# Patient Record
Sex: Female | Born: 1995
Health system: Southern US, Community
[De-identification: ages and names within clinical notes are randomized; demographics above are authoritative.]

## PROBLEM LIST (undated history)

## (undated) HISTORY — PX: ORBITAL FRACTURE SURGERY: SHX725

---
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
## 2017-01-24 ENCOUNTER — Emergency Department (HOSPITAL_COMMUNITY)
Admission: EM | Admit: 2017-01-24 | Discharge: 2017-01-24 | Disposition: A | Discharge status: Home / Self Care | Payer: BLUE CROSS/BLUE SHIELD | Attending: Emergency Medicine | Admitting: Emergency Medicine

## 2017-01-24 ENCOUNTER — Encounter (HOSPITAL_COMMUNITY): Payer: Self-pay | Admitting: Emergency Medicine

## 2017-01-24 DIAGNOSIS — X58XXXA Exposure to other specified factors, initial encounter: Secondary | ICD-10-CM | POA: Diagnosis not present

## 2017-01-24 DIAGNOSIS — Y9389 Activity, other specified: Secondary | ICD-10-CM | POA: Diagnosis not present

## 2017-01-24 DIAGNOSIS — Z79899 Other long term (current) drug therapy: Secondary | ICD-10-CM | POA: Diagnosis not present

## 2017-01-24 DIAGNOSIS — Y998 Other external cause status: Secondary | ICD-10-CM | POA: Diagnosis not present

## 2017-01-24 DIAGNOSIS — S0502XA Injury of conjunctiva and corneal abrasion without foreign body, left eye, initial encounter: Secondary | ICD-10-CM | POA: Diagnosis not present

## 2017-01-24 DIAGNOSIS — F17210 Nicotine dependence, cigarettes, uncomplicated: Secondary | ICD-10-CM | POA: Insufficient documentation

## 2017-01-24 DIAGNOSIS — Y929 Unspecified place or not applicable: Secondary | ICD-10-CM | POA: Insufficient documentation

## 2017-01-24 DIAGNOSIS — H10022 Other mucopurulent conjunctivitis, left eye: Secondary | ICD-10-CM | POA: Diagnosis present

## 2017-01-24 MED ORDER — ERYTHROMYCIN 5 MG/GM OP OINT: TOPICAL_OINTMENT | OPHTHALMIC | 0 refills | Status: DC

## 2017-01-24 MED ORDER — TETRACAINE HCL 0.5 % OP SOLN
2.0000 [drp] | Freq: Once | OPHTHALMIC | Status: AC
  Administered 2017-01-24: 2 [drp] via OPHTHALMIC
  Filled 2017-01-24: qty 4

## 2017-01-24 MED ORDER — FLUORESCEIN SODIUM 1 MG OP STRP
1.0000 | ORAL_STRIP | Freq: Once | OPHTHALMIC | Status: AC
  Administered 2017-01-24: 1 via OPHTHALMIC
  Filled 2017-01-24: qty 1

## 2017-01-24 MED ORDER — ERYTHROMYCIN 5 MG/GM OP OINT: TOPICAL_OINTMENT | OPHTHALMIC | 0 refills | Status: AC

## 2017-01-24 NOTE — ED Provider Notes (Signed)
[facility]-EMERGENCY DEPT Provider Note   CSN: [id number] Arrival date & time: 01/24/17  1846     History   Chief Complaint Chief Complaint  Patient presents with  . Eye Drainage    HPI Grace Abbott is a 22 y.o. female no significant past medical history, who presents to ED for evaluation of 1 day history of left eye redness, clear drainage and foreign body sensation.  She states that she slept in her corrective contact lenses last night.  She removed the contact lenses this morning but started experiencing redness and drainage in her left eye.  She denies any changes in vision.  Reports history of similar symptoms in the past.  She has used over-the-counter eye irritation drops with significant relief in her symptoms.  She denies any other trauma to the area.  She denies any fever, purulent drainage, sick contacts with similar symptoms.  HPI  History reviewed. No pertinent past medical history.  There are no active problems to display for this patient.   Past Surgical History:  Procedure Laterality Date  . ORBITAL FRACTURE SURGERY     r/side    OB History    No data available       Home Medications    Prior to Admission medications   Medication Sig Start Date End Date Taking? Authorizing Provider  diclofenac (VOLTAREN) 50 MG EC tablet Take 1 tablet (50 mg total) by mouth 2 (two) times daily. 12/10/16   Janne Napoleon, NP  erythromycin ophthalmic ointment Place a 1/2 inch ribbon of ointment into the lower eyelid up to 6 times daily. 01/24/17   Dietrich Pates, PA-C    Family History Family History  Problem Relation Age of Onset  . Hypertension Mother   . Diabetes Father     Social History Social History   Tobacco Use  . Smoking status: Current Some Day Smoker    Types: Cigarettes  . Smokeless tobacco: Current User  Substance Use Topics  . Alcohol use: Yes    Comment: occ  . Drug use: Not on file     Allergies   Patient has no  allergy information on record.   Review of Systems Review of Systems  Constitutional: Negative for chills and fever.  Eyes: Positive for discharge and redness. Negative for photophobia, pain and itching.  Gastrointestinal: Negative for nausea and vomiting.  Skin: Negative for wound.     Physical Exam Updated Vital Signs BP (!) 142/93 (BP Location: Left Arm)   Pulse (!) 101   Temp 99.4 F (37.4 C) (Oral)   Resp 20   SpO2 100%   Physical Exam  Constitutional: She appears well-developed and well-nourished. No distress.  Nontoxic appearing and in no acute distress.  HENT:  Head: Normocephalic and atraumatic.  Eyes: EOM are normal. Pupils are equal, round, and reactive to light. Left eye exhibits discharge. Left conjunctiva is injected. No scleral icterus. Right eye exhibits normal extraocular motion. Left eye exhibits normal extraocular motion.  Left eye with injected conjunctiva, no eyelid swelling or erythema or tenderness to palpation.  Mild clear tearful drainage noted.  No foreign bodies noted.  No pain with EOMs.  No chemosis, proptosis, or consensual photophobia. Fluorescein stain with small corneal abrasion at 3:00 position of L eye; no foreign bodies, dendritic lesions, ulcerations, negative Sidel sign.  Neck: Normal range of motion.  Pulmonary/Chest: Effort normal. No respiratory distress.  Neurological: She is alert.  Skin: No rash noted. She is not diaphoretic.  Psychiatric: She has a normal mood and affect.  Nursing note and vitals reviewed.    ED Treatments / Results  Labs (all labs ordered are listed, but only abnormal results are displayed) Labs Reviewed - No data to display  EKG  EKG Interpretation None       Radiology No results found.  Procedures Procedures (including critical care time)  Medications Ordered in ED Medications  fluorescein ophthalmic strip 1 strip (1 strip Left Eye Given by Other 01/24/17 2017)  tetracaine (PONTOCAINE) 0.5 %  ophthalmic solution 2 drop (2 drops Left Eye Given by Other 01/24/17 2016)     Initial Impression / Assessment and Plan / ED Course  I have reviewed the triage vital signs and the nursing notes.  Pertinent labs & imaging results that were available during my care of the patient were reviewed by me and considered in my medical decision making (see chart for details).     Patient presents to ED for evaluation of left eye redness, clear drainage and for a body sensation upon waking up this morning.  States that she slept her contacts and began having the discomfort after she removed them this morning.  Denies any vision changes, fevers, swelling around eye or other trauma to the area.  On physical exam she is overall well-appearing.  Conjunctival injection and clear drainage noted in left eye.  Fluorescein exam revealed a small corneal abrasion in 3 o'clock position of left eye.  She continues to deny any vision changes.  No abnormalities in visual acuity examination.  There are no other abnormalities unforeseen exam.  I have low suspicion for HSV, orbital cellulitis, or iritis being the cause of her symptoms.  Will give patient antibiotic ointment, advised her to discontinue contact lens use until evaluated by her eye doctor.  Advised Tylenol as needed for pain.  Patient appears stable for discharge at this time.  Strict return precautions given.  Final Clinical Impressions(s) / ED Diagnoses   Final diagnoses:  Abrasion of left cornea, initial encounter    ED Discharge Orders        Ordered    erythromycin ophthalmic ointment  Status:  Discontinued     01/24/17 2044    erythromycin ophthalmic ointment     01/24/17 2105     Portions of this note were generated with Dragon dictation software. Dictation errors may occur despite best attempts at proofreading.    Dietrich PatesKhatri, Chamika Cunanan, PA-C 01/24/17 2111    Pricilla LovelessGoldston, Scott, MD 01/24/17 [phone]

## 2017-01-24 NOTE — ED Provider Notes (Signed)
Patient placed in Quick Look pathway, seen and evaluated   Chief Complaint: left eye redness   HPI:   Left eye redness, tearing, FB sensation and pain to upper eye lid since this morning. Contact corrective lens wearer. Slept in contact lens last night, woke up this morning and noticed symptoms. No changes in vision or headache.   ROS: eye redness   Physical Exam:   Gen: No distress  Neuro: Awake and Alert  Skin: Warm    Focused Exam: Left upper eye lid with mild tenderness but no injury, lag or palpable mass. Clear tearing. Medial scleral injection with prominent vessels, does not cross past iris white. Conjunctiva pink. PERRL and EOMs intact. ?Tiny white spot to 5 o'clock of iris.   Initiation of care has begun. The patient has been counseled on the process, plan, and necessity for staying for the completion/evaluation, and the remainder of the medical screening examination    Jerrell MylarGibbons, Claudia J, PA-C 01/24/17 Fransico [id number]    Goldston, Scott, MD 01/24/17 [phone]

## 2017-01-24 NOTE — Discharge Instructions (Signed)
Please read attached information regarding your condition. Use antibiotic ointment in affected eye as directed. Do not wear contact lenses until evaluated by her eye doctor. Follow-up with your eye doctor for further evaluation. Take Tylenol as needed for pain. Return to ED for worsening symptoms, additional injuries, loss of vision or blurry vision, head injuries or swelling around eye.

## 2017-01-24 NOTE — ED Triage Notes (Signed)
Patient presents with left eye redness and drainage. Light sensitivity and feels like a foreign object in the eye. Slept with contacts in but removed them this am.

## 2018-08-09 IMAGING — CR DG KNEE COMPLETE 4+V*L*
5 series · 5 of 5 positions shown · non-contrast
Comparison: None.

CLINICAL DATA: Left knee pain.  Felt pop

EXAM:
LEFT KNEE - COMPLETE 4+ VIEW

[t knee ap left]
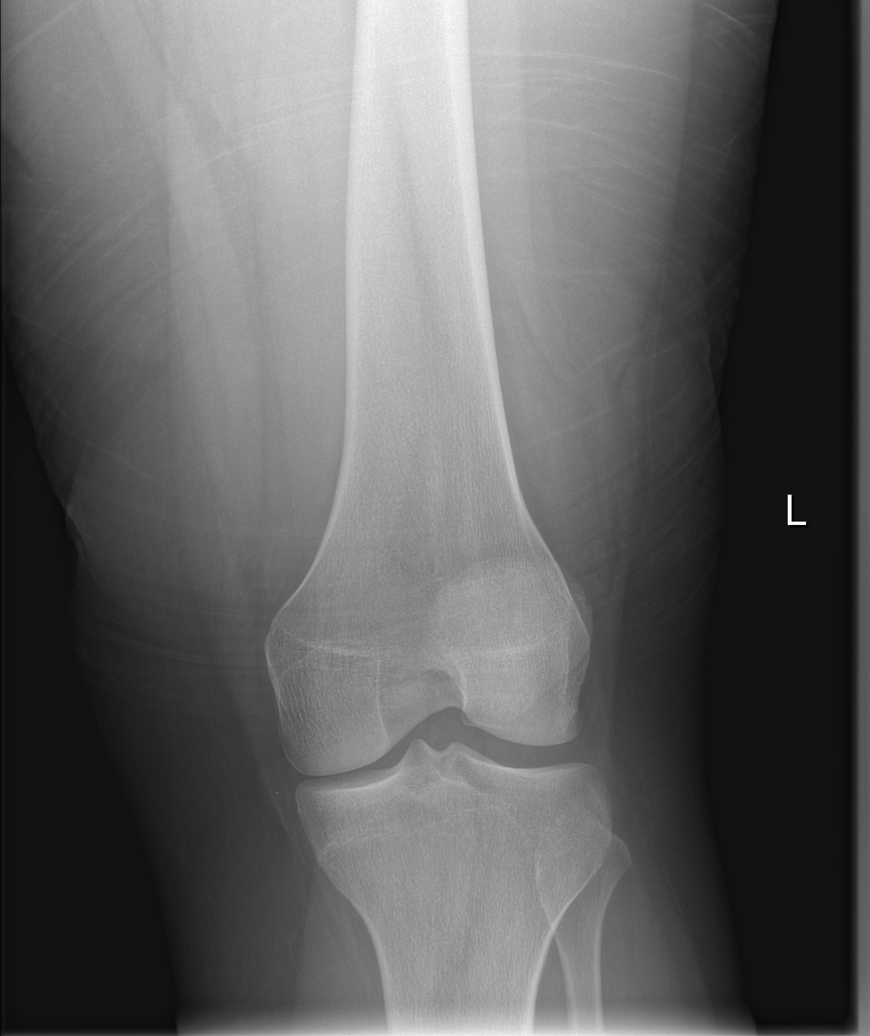

[t knee obl left (1 of 2)]
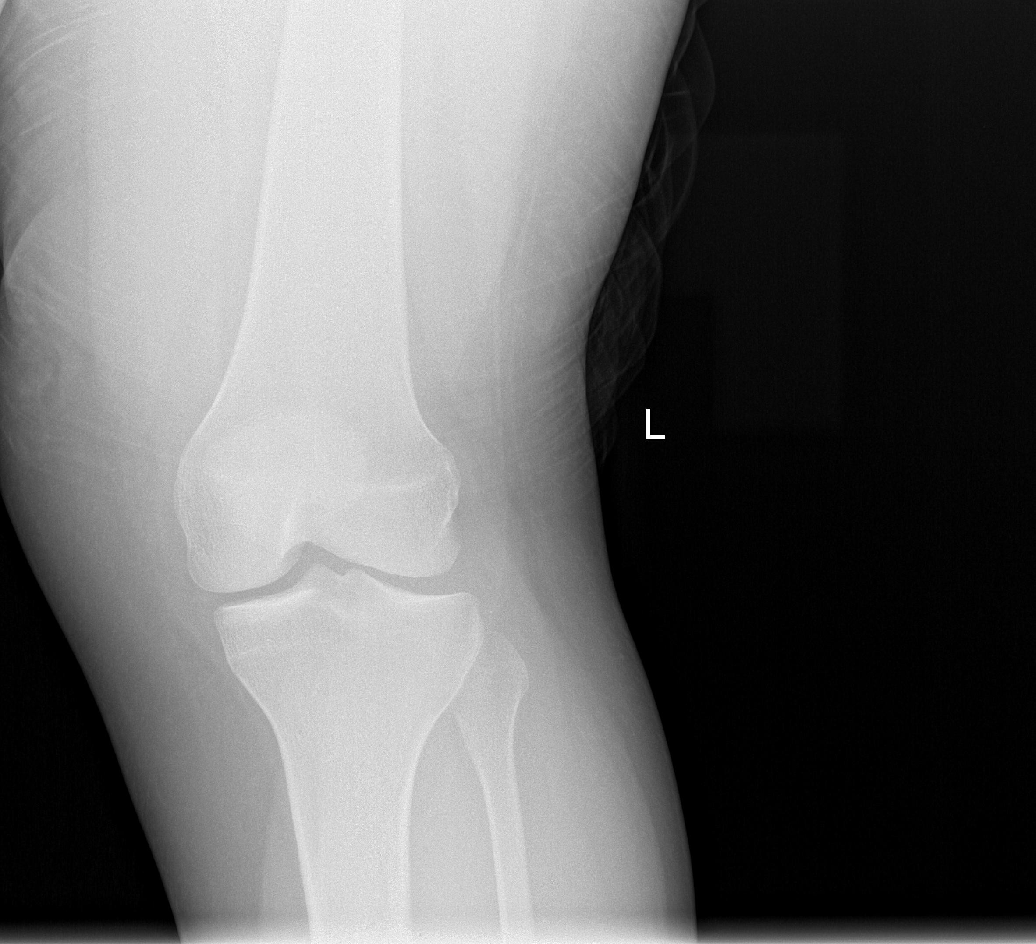

[t knee obl left (2 of 2)]
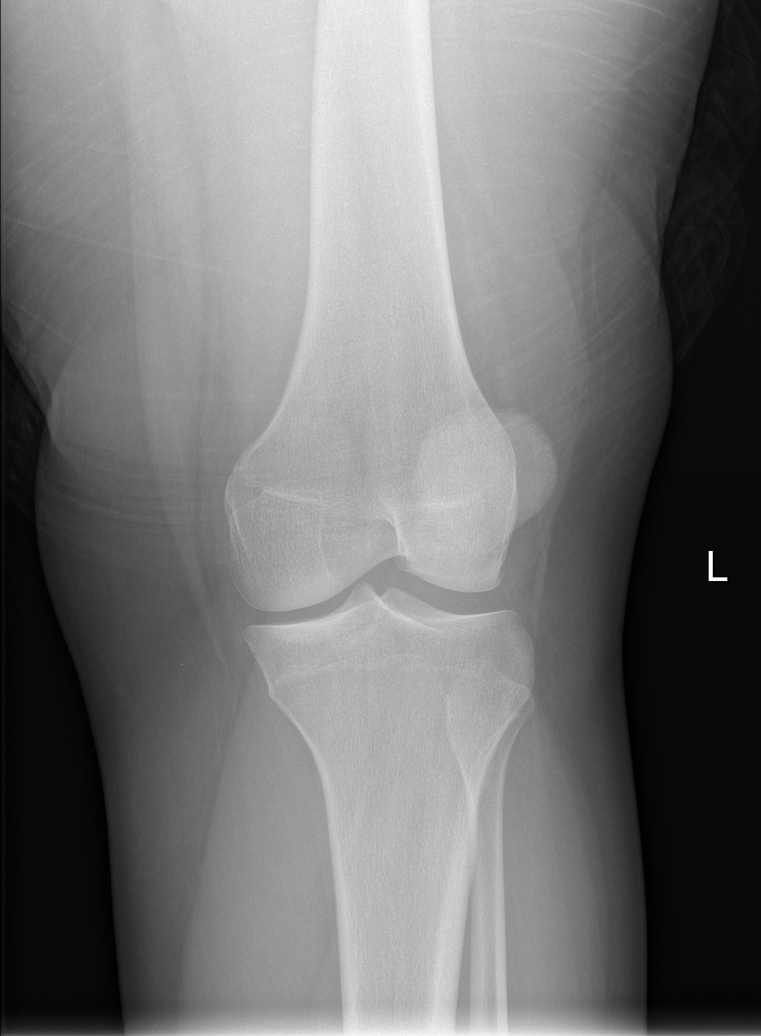

[x knee lat left (1 of 2)]
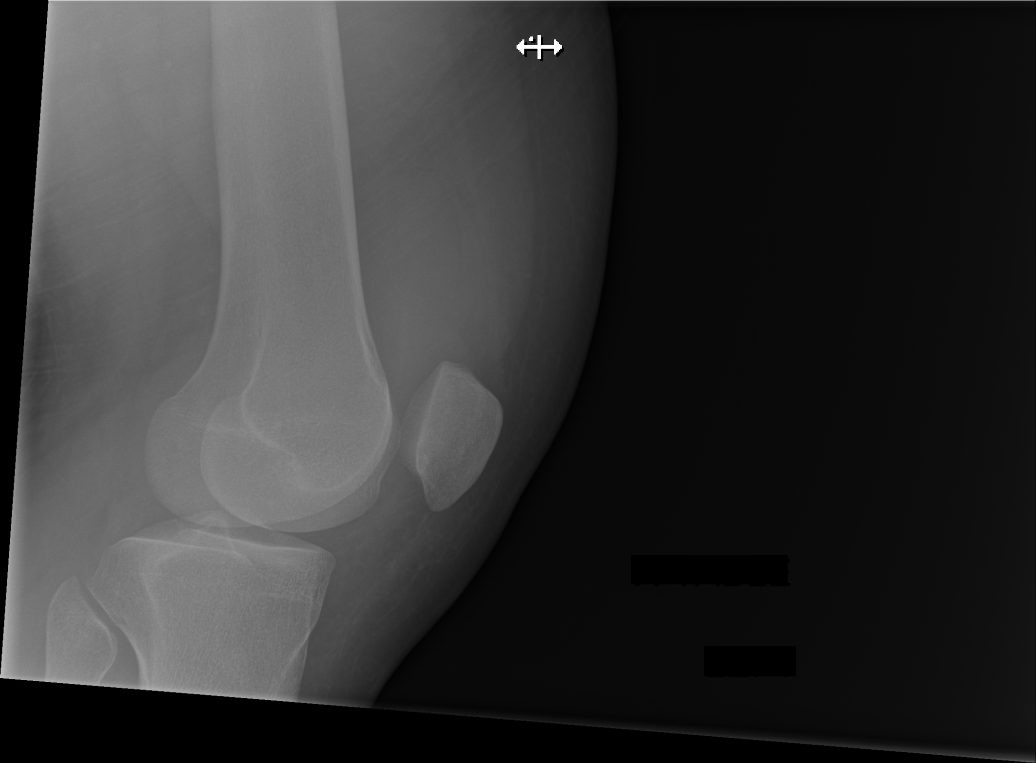

[x knee lat left (2 of 2)]
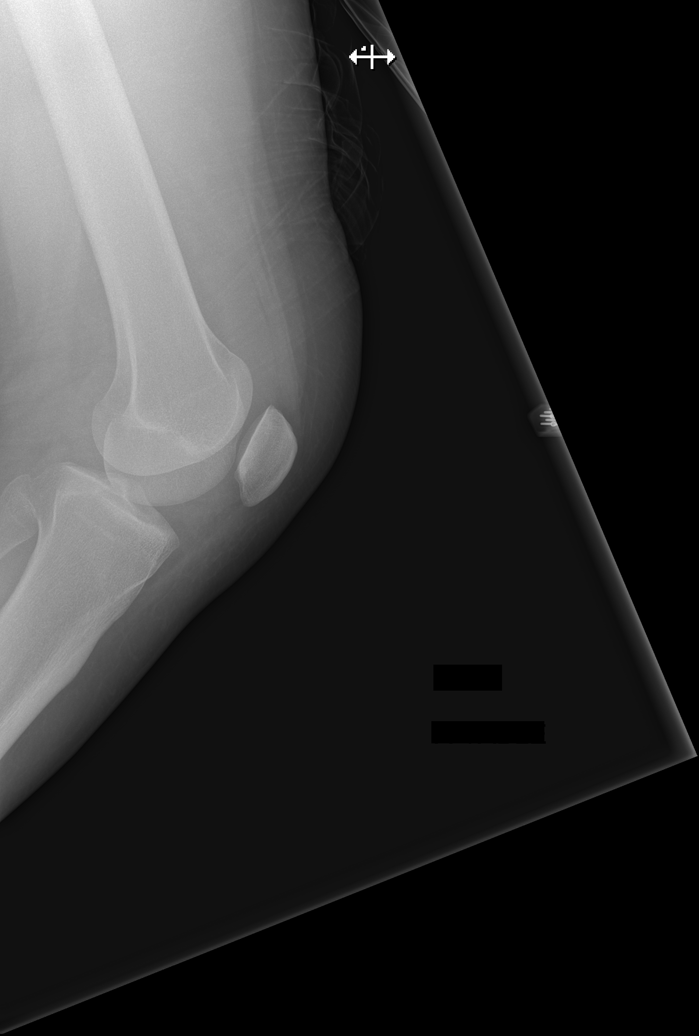

[5 of 5 positions shown; findings below may reference images not displayed]

FINDINGS: Small joint effusion. No acute bony abnormality. Specifically, no
fracture, subluxation, or dislocation. Soft tissues are intact.
IMPRESSION: Small joint effusion.  No acute bony abnormality.
